# Patient Record
Sex: Female | Born: 1985 | Hispanic: No | Marital: Single
Health system: Southern US, Community
[De-identification: ages and names within clinical notes are randomized; demographics above are authoritative.]

---

## 2007-08-29 ENCOUNTER — Ambulatory Visit: Payer: Self-pay | Admitting: Internal Medicine

## 2008-03-15 ENCOUNTER — Ambulatory Visit: Payer: Self-pay | Admitting: General Practice

## 2009-04-07 IMAGING — CT CT HEAD WITHOUT CONTRAST
2 series · 16 of 30 positions shown, 20 images · non-contrast
Comparison: none

REASON FOR EXAM: headaches   dizziness
COMMENTS:

[Series 2: without · axial · non-contrast · 0.39mm/px · z∈[+904,+1024]mm · 13 of 30 slices shown, 17 images]
[im 3/30  brain]
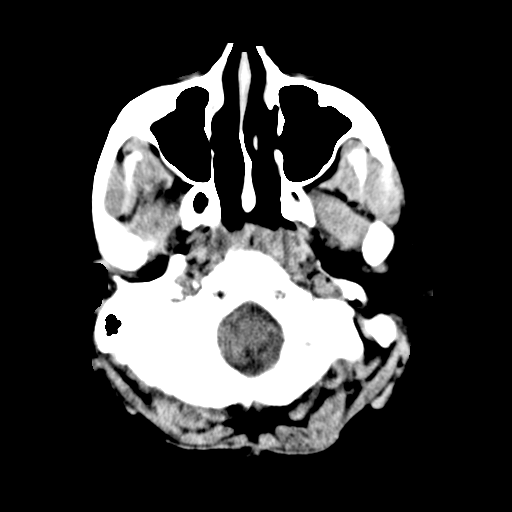
[im 3/30  bone]
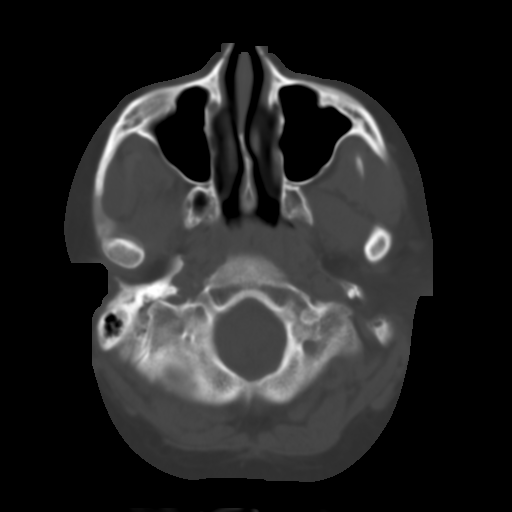
[im 5/30  brain]
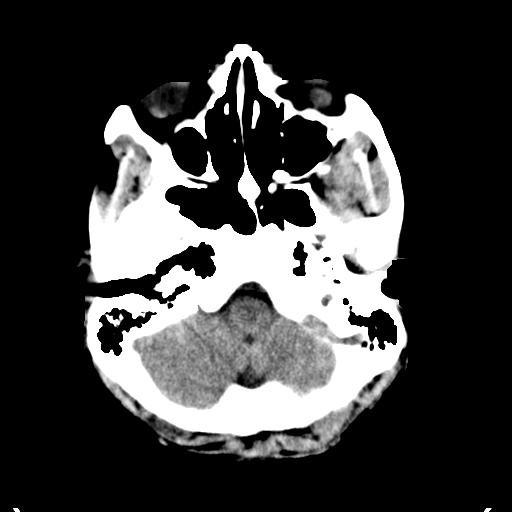
[im 7/30  brain]
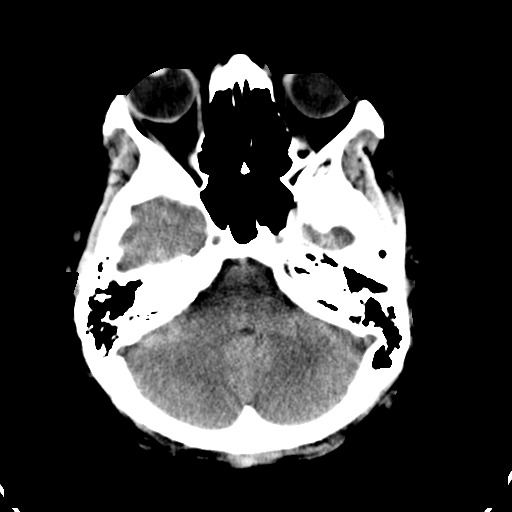
[im 9/30  brain]
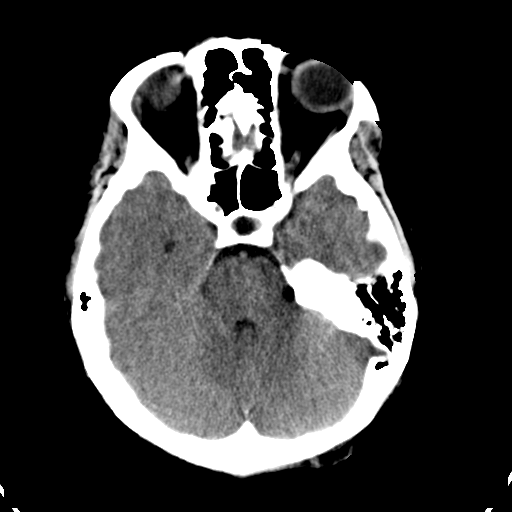
[im 11/30  brain]
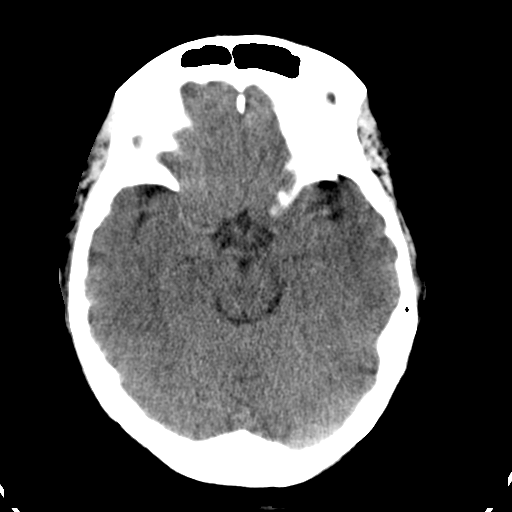
[im 11/30  bone]
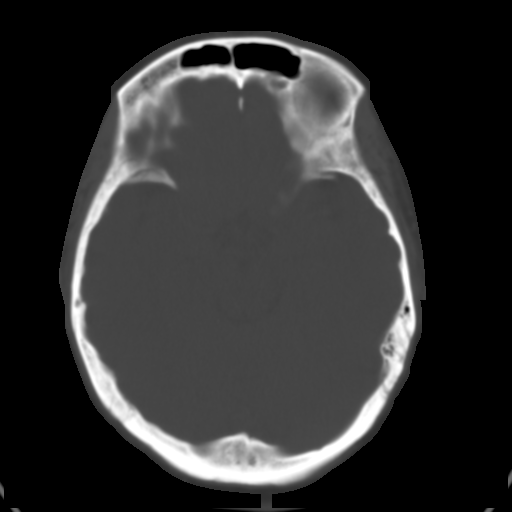
[im 13/30  brain]
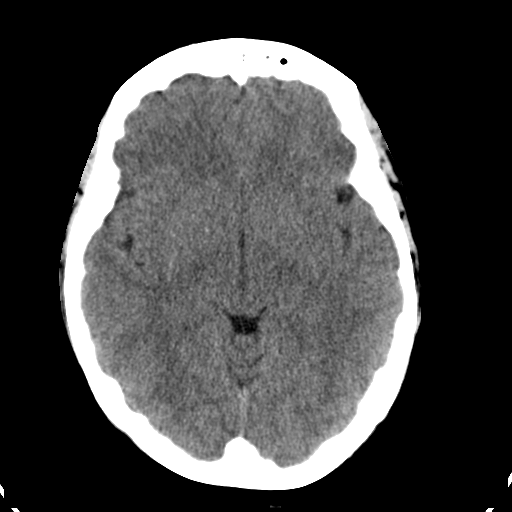
[im 15/30  brain]
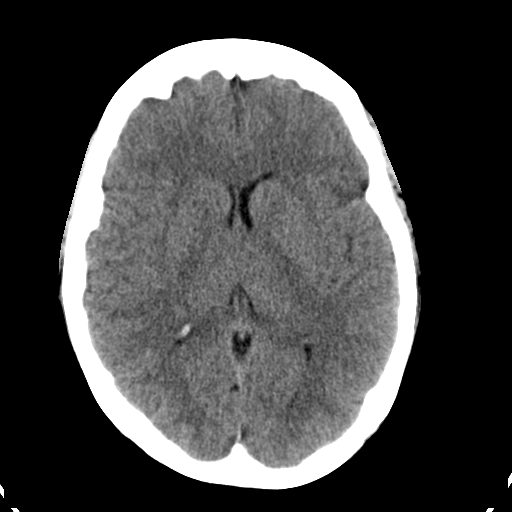
[im 17/30  brain]
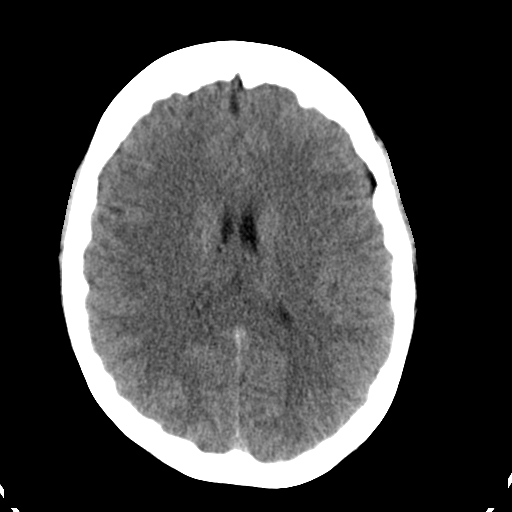
[im 19/30  brain]
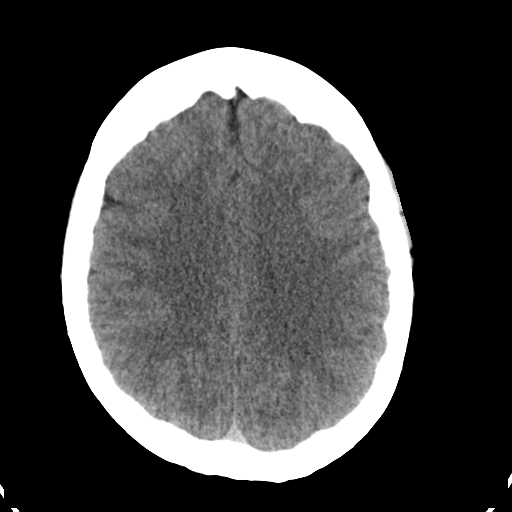
[im 19/30  bone]
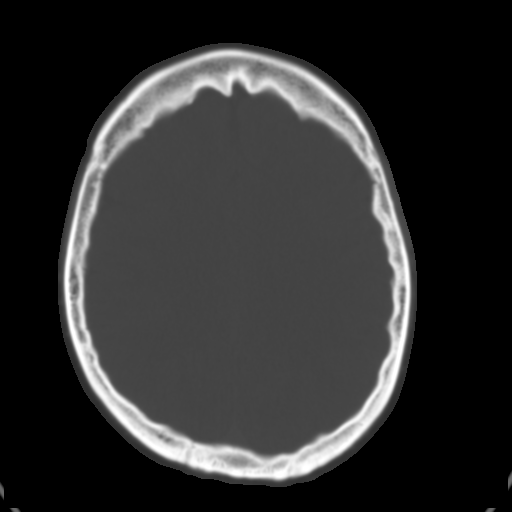
[im 21/30  brain]
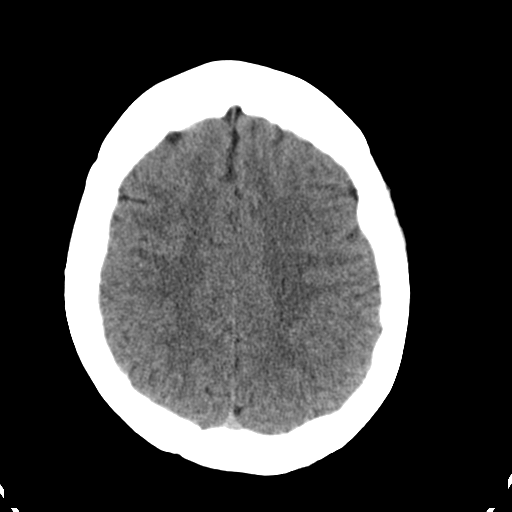
[im 23/30  brain]
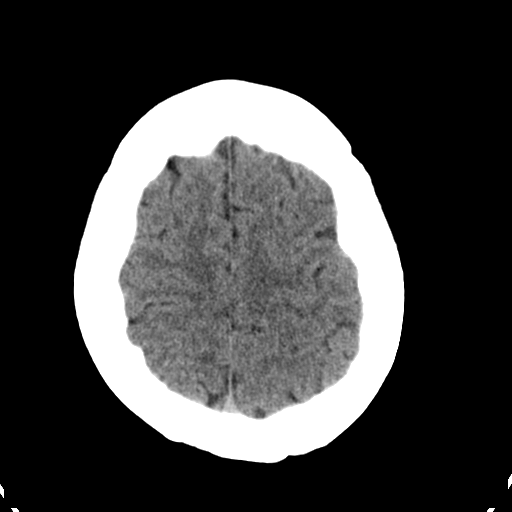
[im 25/30  brain]
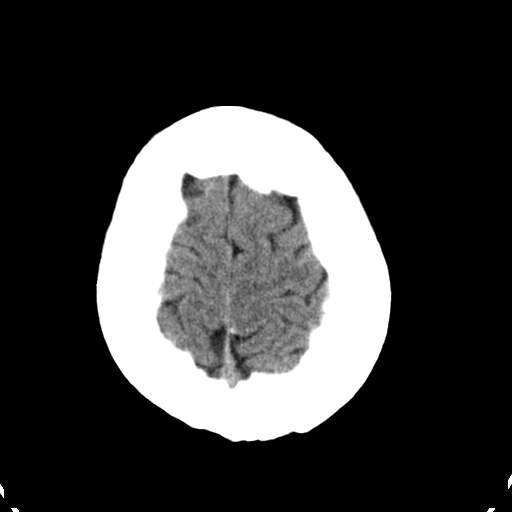
[im 27/30  brain]
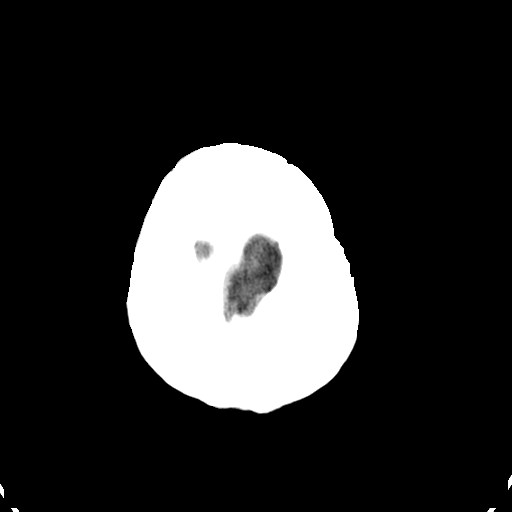
[im 27/30  bone]
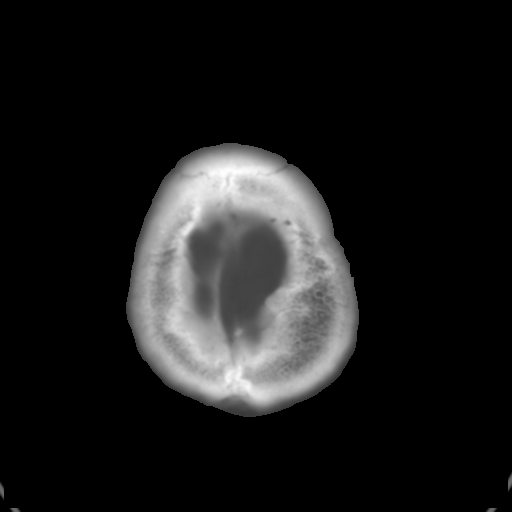

[Series 3: bone · axial · 0.39mm/px · z∈[+904,+944]mm · 3 of 30 slices shown]
[im 3/30  bone]
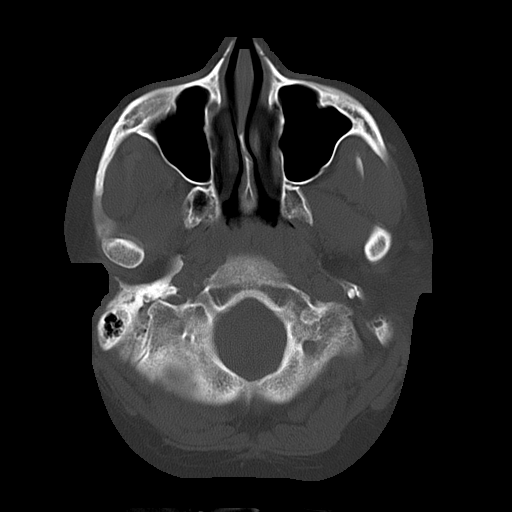
[im 7/30  bone]
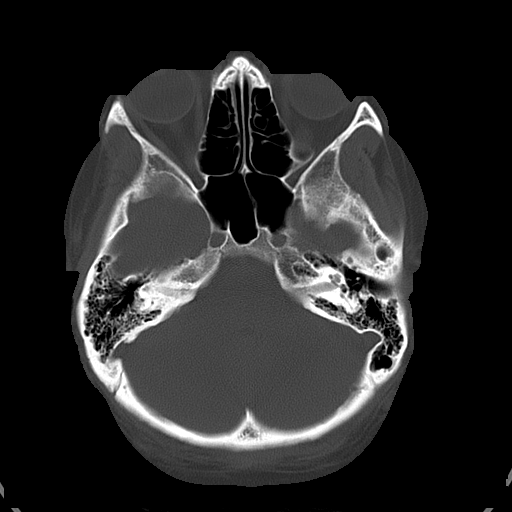
[im 11/30  bone]
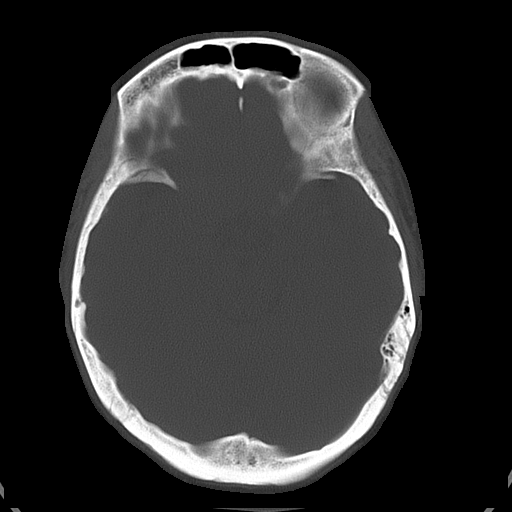

[16 of 30 positions shown; findings below may reference images not displayed]

PROCEDURE:     CT  - CT HEAD WITHOUT CONTRAST  - August 29, 2007  [DATE]

RESULT:     There is no previous exam for comparison area

The ventricles and sulci are normal. There is no hemorrhage. There is no
focal mass, mass-effect or midline shift. There is no evidence of edema or
territorial infarct. The bone windows demonstrate normal aeration of the
paranasal sinuses and mastoid air cells with the exception of some minimal
density in the left maxillary sinus which appears to represent chronic
mucosal thickening possibly with some inspissated mucus.. There is no skull
fracture demonstrated.
IMPRESSION: 1. No acute intracranial abnormality.
2. No evidence of acute sinusitis.

## 2010-10-20 ENCOUNTER — Inpatient Hospital Stay: Payer: Self-pay | Admitting: Unknown Physician Specialty

## 2011-12-03 ENCOUNTER — Ambulatory Visit: Payer: Self-pay

## 2013-07-13 IMAGING — NM NM THYROID IMAGING W/ UPTAKE SINGLE (24 HR)
1 series · 3 of 3 positions shown · non-contrast
Comparison: none

REASON FOR EXAM: solid thyroid nodule on US of thyroid
COMMENTS:

PROCEDURE:     KNM - KNM THYROID R-YUB 24HR [DATE] [DATE]
RESULT:
PROCEDURE:  The patient received 144.1 microcuries of R-YUB administered
orally via pill form. Appropriate scintigraphic images of the thyroid were
obtained. 6 and 24 hour percent uptake measurements were also obtained.

[Series 1000: (id) thyroid scan · 2.40mm/px · 3 of 3 slices shown]
[im 1/3  full-range]
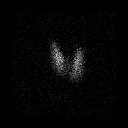
[im 2/3  full-range]
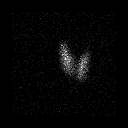
[im 3/3  full-range]
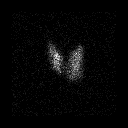

[3 of 3 positions shown; findings below may reference images not displayed]

FINDINGS: Diffuse homogeneous radiotracer activity is identified within a
normal size appearing thyroid. There is no evidence of photopenia or areas
of hyperintensity. Thyroid uptake measurements are within normal limits, 6
hours at 12.5% and 24 hours at 24.5%.
IMPRESSION: Unremarkable R-YUB thyroid uptake evaluation as described
above.

## 2020-05-10 ENCOUNTER — Emergency Department
Admission: EM | Admit: 2020-05-10 | Discharge: 2020-05-10 | Disposition: A | Discharge status: Home / Self Care | Payer: Self-pay | Attending: Emergency Medicine | Admitting: Emergency Medicine

## 2020-05-10 ENCOUNTER — Other Ambulatory Visit: Payer: Self-pay

## 2020-05-10 DIAGNOSIS — R109 Unspecified abdominal pain: Secondary | ICD-10-CM | POA: Insufficient documentation

## 2020-05-10 DIAGNOSIS — Z5321 Procedure and treatment not carried out due to patient leaving prior to being seen by health care provider: Secondary | ICD-10-CM | POA: Insufficient documentation

## 2020-05-10 DIAGNOSIS — R079 Chest pain, unspecified: Secondary | ICD-10-CM | POA: Insufficient documentation

## 2020-05-10 DIAGNOSIS — R0602 Shortness of breath: Secondary | ICD-10-CM | POA: Insufficient documentation

## 2020-05-10 LAB — CBC
HCT: 36.2 % (ref 36.0–46.0)
Hemoglobin: 10.8 g/dL — ABNORMAL LOW (ref 12.0–15.0)
MCH: 23.6 pg — ABNORMAL LOW (ref 26.0–34.0)
MCHC: 29.8 g/dL — ABNORMAL LOW (ref 30.0–36.0)
MCV: 79.2 fL — ABNORMAL LOW (ref 80.0–100.0)
Platelets: 442 10*3/uL — ABNORMAL HIGH (ref 150–400)
RBC: 4.57 MIL/uL (ref 3.87–5.11)
RDW: 17.8 % — ABNORMAL HIGH (ref 11.5–15.5)
WBC: 13.4 10*3/uL — ABNORMAL HIGH (ref 4.0–10.5)
nRBC: 0 % (ref 0.0–0.2)

## 2020-05-10 LAB — BASIC METABOLIC PANEL
Anion gap: 11 (ref 5–15)
BUN: 10 mg/dL (ref 6–20)
CO2: 26 mmol/L (ref 22–32)
Calcium: 8.7 mg/dL — ABNORMAL LOW (ref 8.9–10.3)
Chloride: 104 mmol/L (ref 98–111)
Creatinine, Ser: 0.56 mg/dL (ref 0.44–1.00)
GFR, Estimated: >60 mL/min (ref >60)
Glucose, Bld: 117 mg/dL — ABNORMAL HIGH (ref 70–99)
Potassium: 3.7 mmol/L (ref 3.5–5.1)
Sodium: 141 mmol/L (ref 135–145)

## 2020-05-10 LAB — TROPONIN I (HIGH SENSITIVITY): Troponin I (High Sensitivity): 3 ng/L (ref <18)

## 2020-05-10 NOTE — ED Notes (Signed)
Pt and pt's mother approached first nurse reports they are leaving, RN encouraged to stay. Pt and pt's mother decided to leave

## 2020-05-10 NOTE — ED Triage Notes (Addendum)
Pt states she is coming in with chest pain and abdominal pain. Pt states pain has come and gone over the last several days, but since 8pm today pain has been constant and a 9/10 pain. Pt states pain is causing some shortness of breath Pt denies any major medical conditions
